# Patient Record
Sex: Female | Born: 1995 | Race: Black or African American | Hispanic: No | Marital: Single
Health system: Southern US, Community
[De-identification: ages and names within clinical notes are randomized; demographics above are authoritative.]

---

## 2021-06-10 ENCOUNTER — Emergency Department
Admission: EM | Admit: 2021-06-10 | Discharge: 2021-06-10 | Disposition: A | Discharge status: Home / Self Care | Payer: Medicaid Other | Attending: Emergency Medicine | Admitting: Emergency Medicine

## 2021-06-10 ENCOUNTER — Emergency Department: Payer: Medicaid Other

## 2021-06-10 ENCOUNTER — Other Ambulatory Visit: Payer: Self-pay

## 2021-06-10 DIAGNOSIS — W19XXXA Unspecified fall, initial encounter: Secondary | ICD-10-CM

## 2021-06-10 DIAGNOSIS — S0990XA Unspecified injury of head, initial encounter: Secondary | ICD-10-CM | POA: Insufficient documentation

## 2021-06-10 DIAGNOSIS — S199XXA Unspecified injury of neck, initial encounter: Secondary | ICD-10-CM | POA: Insufficient documentation

## 2021-06-10 LAB — POC URINE PREG, ED: Preg Test, Ur: NEGATIVE

## 2021-06-10 MED ORDER — METHOCARBAMOL 500 MG PO TABS: 500.0000 mg | ORAL_TABLET | Freq: Three times a day (TID) | ORAL | 0 refills | Status: AC | PRN

## 2021-06-10 MED ORDER — MELOXICAM 15 MG PO TABS: 15.0000 mg | ORAL_TABLET | Freq: Every day | ORAL | 1 refills | Status: AC

## 2021-06-10 NOTE — ED Provider Notes (Signed)
Emergency Medicine Provider Triage Evaluation Note  Leslie Howell , a 25 y.o. female  was evaluated in triage.  Pt complains of headache, decreased hearing in both ears, and upper back pain after flipping backward out of a chair on Saturday. Pain has gradually worsened. She had 2-3 episodes of vomiting this morning.  Review of Systems  Positive: Headache, hearing change, back pain Negative: Blurred vision  Physical Exam  There were no vitals taken for this visit. Gen:   Awake, no distress   Resp:  Normal effort  MSK:   Moves extremities without difficulty. Upper back pain Other:  Tearful  Medical Decision Making    Medically screening exam initiated at 2:50 PM.  Appropriate orders placed.  Leslie Howell was informed that the remainder of the evaluation will be completed by another provider, this initial triage assessment does not replace that evaluation, and the importance of remaining in the ED until their evaluation is complete.    Chinita Pester, FNP 06/10/21 1454    Dionne Bucy, MD 06/10/21 [phone]

## 2021-06-10 NOTE — Discharge Instructions (Addendum)
Take Meloxicam and Robaxin as directed.  

## 2021-06-10 NOTE — ED Provider Notes (Signed)
[facility]-EMERGENCY DEPARTMENT  ____________________________________________  Time seen: Approximately 6:20 PM  I have reviewed the triage vital signs and the nursing notes.   HISTORY  Chief Complaint Fall   Historian Patient     HPI Leslie Howell is a 25 y.o. female presents to the emergency department with neck pain and posterior headache after patient fell backwards from a rolling chair.  Patient states that this injury occurred 5 days ago.  She had no loss of consciousness and states that she did not really have bad pain initially.  She states that her body aches have progressively worsened and she became concerned.  She states that she has had some irregular periods the past couple months and did take a pregnancy test earlier today which was negative.  She denies nausea, vomiting, blurry vision and dizziness.   History reviewed. No pertinent past medical history.   Immunizations up to date:  Yes.     History reviewed. No pertinent past medical history.  There are no problems to display for this patient.   History reviewed. No pertinent surgical history.  Prior to Admission medications   Medication Sig Start Date End Date Taking? Authorizing Provider  meloxicam (MOBIC) 15 MG tablet Take 1 tablet (15 mg total) by mouth daily. 06/10/21 06/10/22 Yes Pia Mau M, PA-C  methocarbamol (ROBAXIN) 500 MG tablet Take 1 tablet (500 mg total) by mouth every 8 (eight) hours as needed for up to 5 days. 06/10/21 06/15/21 Yes Orvil Feil, PA-C    Allergies Patient has no allergy information on record.  No family history on file.  Social History     Review of Systems  Constitutional: No fever/chills Eyes:  No discharge ENT: No upper respiratory complaints. Respiratory: no cough. No SOB/ use of accessory muscles to breath Gastrointestinal:   No nausea, no vomiting.  No diarrhea.  No constipation. Musculoskeletal: Patient has neck pain.  Skin: Negative for rash,  abrasions, lacerations, ecchymosis.   ____________________________________________   PHYSICAL EXAM:  VITAL SIGNS: ED Triage Vitals  Enc Vitals Group     BP 06/10/21 1452 (!) 158/106     Pulse Rate 06/10/21 1452 (!) 115     Resp 06/10/21 1452 19     Temp 06/10/21 1452 98 F (36.7 C)     Temp src --      SpO2 06/10/21 1452 96 %     Weight --      Height --      Head Circumference --      Peak Flow --      Pain Score 06/10/21 1451 9     Pain Loc --      Pain Edu? --      Excl. in GC? --      Constitutional: Alert and oriented. Well appearing and in no acute distress. Eyes: Conjunctivae are normal. PERRL. EOMI. Head: Atraumatic. ENT:      Nose: No congestion/rhinnorhea.      Mouth/Throat: Mucous membranes are moist.  Neck: No stridor.  Full range of motion.  No midline C-spine tenderness to palpation. Cardiovascular: Normal rate, regular rhythm. Normal S1 and S2.  Good peripheral circulation. Respiratory: Normal respiratory effort without tachypnea or retractions. Lungs CTAB. Good air entry to the bases with no decreased or absent breath sounds Gastrointestinal: Bowel sounds x 4 quadrants. Soft and nontender to palpation. No guarding or rigidity. No distention. Musculoskeletal: Full range of motion to all extremities. No obvious deformities noted Neurologic:  Normal for age. No gross  focal neurologic deficits are appreciated.  Skin:  Skin is warm, dry and intact. No rash noted. Psychiatric: Mood and affect are normal for age. Speech and behavior are normal.   ____________________________________________   LABS (all labs ordered are listed, but only abnormal results are displayed)  Labs Reviewed  POC URINE PREG, ED   ____________________________________________  EKG   ____________________________________________  RADIOLOGY Geraldo Pitter, personally viewed and evaluated these images (plain radiographs) as part of my medical decision making, as well as  reviewing the written report by the radiologist.  CT Head Wo Contrast  Result Date: 06/10/2021 CLINICAL DATA:  Larey Seat a few days ago. Now with headache and ringing in the ears. EXAM: CT HEAD WITHOUT CONTRAST TECHNIQUE: Contiguous axial images were obtained from the base of the skull through the vertex without intravenous contrast. COMPARISON:  None. FINDINGS: Brain: The ventricles are normal in size and configuration. No extra-axial fluid collections are identified. The gray-white differentiation is maintained. No CT findings for acute hemispheric infarction or intracranial hemorrhage. No mass lesions. The brainstem and cerebellum are normal. Vascular: No hyperdense vessels or obvious aneurysm. Skull: No acute skull fracture.  No bone lesion. Sinuses/Orbits: The paranasal sinuses and mastoid air cells are clear. The globes are intact. Other: No scalp lesions, laceration or hematoma. IMPRESSION: Normal head CT. Electronically Signed   By: Rudie Meyer M.D.   On: 06/10/2021 15:48   CT Cervical Spine Wo Contrast  Result Date: 06/10/2021 CLINICAL DATA:  Neck pain.  Larey Seat a few days ago. EXAM: CT CERVICAL SPINE WITHOUT CONTRAST TECHNIQUE: Multidetector CT imaging of the cervical spine was performed without intravenous contrast. Multiplanar CT image reconstructions were also generated. COMPARISON:  None. FINDINGS: Alignment: Normal Skull base and vertebrae: No acute fracture. No primary bone lesion or focal pathologic process. Soft tissues and spinal canal: No prevertebral fluid or swelling. No visible canal hematoma. Disc levels: The spinal canal is generous. No spinal or foraminal stenosis. Upper chest: The lung apices are clear. Other: No neck mass or adenopathy. The thyroid gland is unremarkable. IMPRESSION: 1. Normal alignment and no acute cervical spine fracture. 2. The spinal canal is generous. No spinal or foraminal stenosis. Electronically Signed   By: Rudie Meyer M.D.   On: 06/10/2021 15:51     ____________________________________________    PROCEDURES  Procedure(s) performed:     Procedures     Medications - No data to display   ____________________________________________   INITIAL IMPRESSION / ASSESSMENT AND PLAN / ED COURSE  Pertinent labs & imaging results that were available during my care of the patient were reviewed by me and considered in my medical decision making (see chart for details).      Assessment and plan:  Fall:  25 year old female presents to the emergency department after a mechanical fall 5 days ago.  CTs of the head and cervical spine were reassuring.  Urine pregnancy test was negative.  She was discharged with meloxicam and Robaxin.     ____________________________________________  FINAL CLINICAL IMPRESSION(S) / ED DIAGNOSES  Final diagnoses:  Fall, initial encounter      NEW MEDICATIONS STARTED DURING THIS VISIT:  ED Discharge Orders          Ordered    meloxicam (MOBIC) 15 MG tablet  Daily        06/10/21 1855    methocarbamol (ROBAXIN) 500 MG tablet  Every 8 hours PRN        06/10/21 1855  This chart was dictated using voice recognition software/Dragon. Despite best efforts to proofread, errors can occur which can change the meaning. Any change was purely unintentional.     Orvil Feil, PA-C 06/10/21 1858    Gilles Chiquito, MD 06/10/21 Norberta Keens

## 2021-06-10 NOTE — ED Triage Notes (Signed)
Pt comes with c/o fall on Saturday. Pt states she had a mechanical fall. Pt states pain to lower back, side and ringing in ears. Pt denies any LOC. Pt states she did hit her head.  Pt states pain is now worse.

## 2022-12-20 IMAGING — CT CT CERVICAL SPINE W/O CM
3 of 4 series · 9 of 33 positions shown, 11 images · non-contrast
Comparison: None.

CLINICAL DATA: Neck pain.  Fell a few days ago.

EXAM:
CT CERVICAL SPINE WITHOUT CONTRAST
TECHNIQUE: Multidetector CT imaging of the cervical spine was performed without
intravenous contrast. Multiplanar CT image reconstructions were also
generated.

[Series 6: sagittal bone · sagittal · 0.18mm/px · 5 of 72 slices shown, 6 images]
[im 24/72  bone]
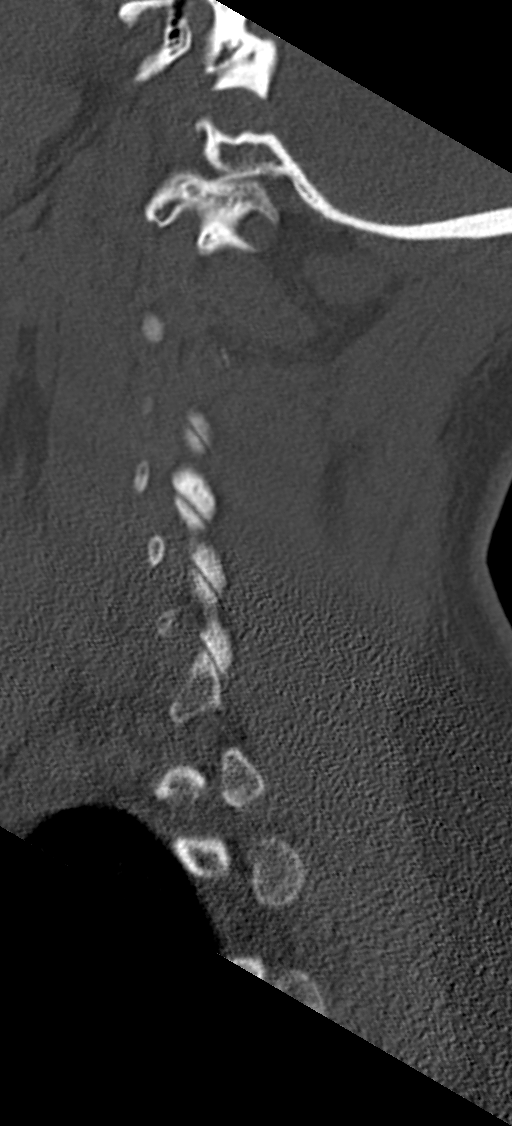
[im 30/72  bone]
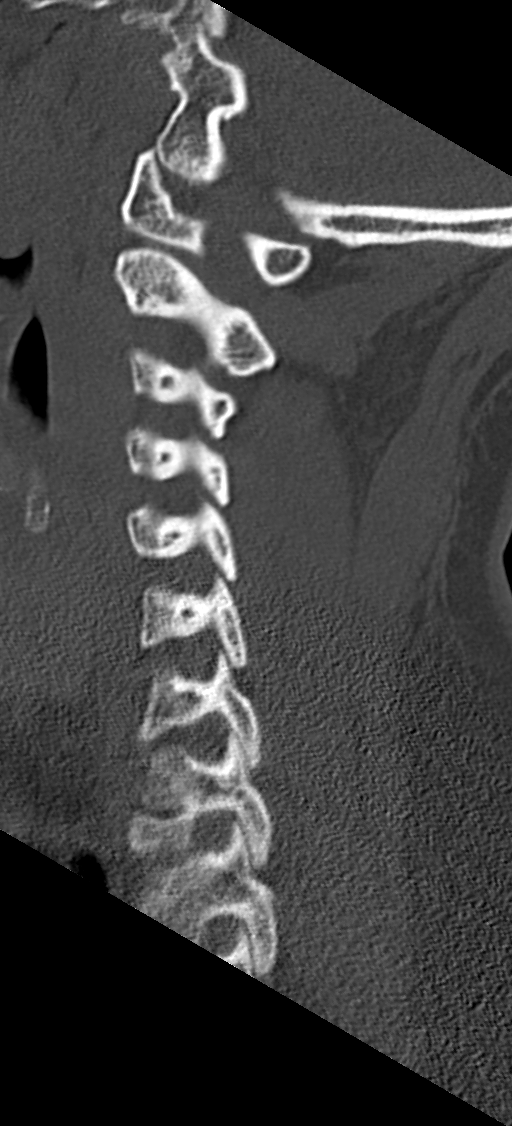
[im 36/72  soft-tissue]
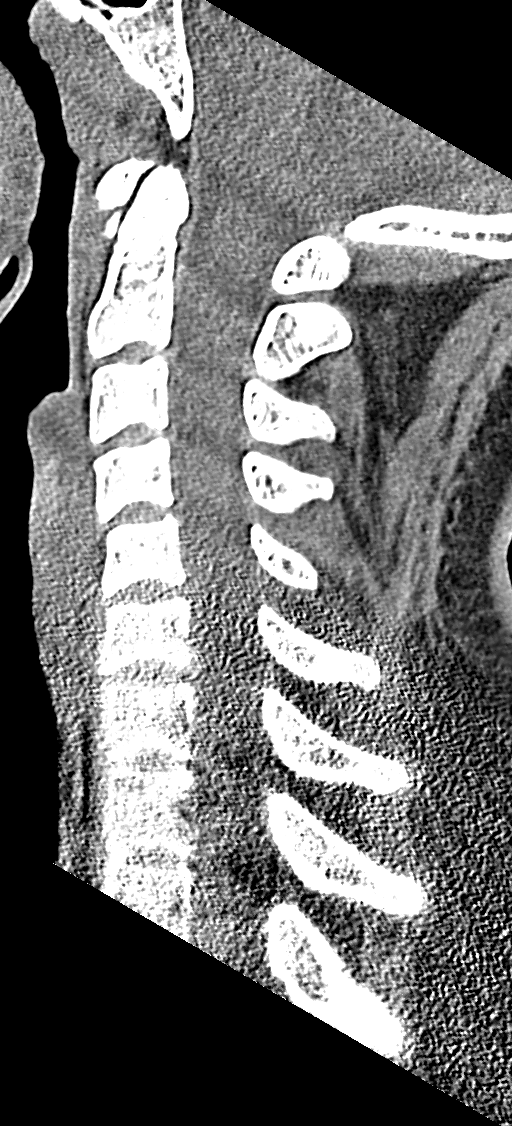
[im 36/72  bone]
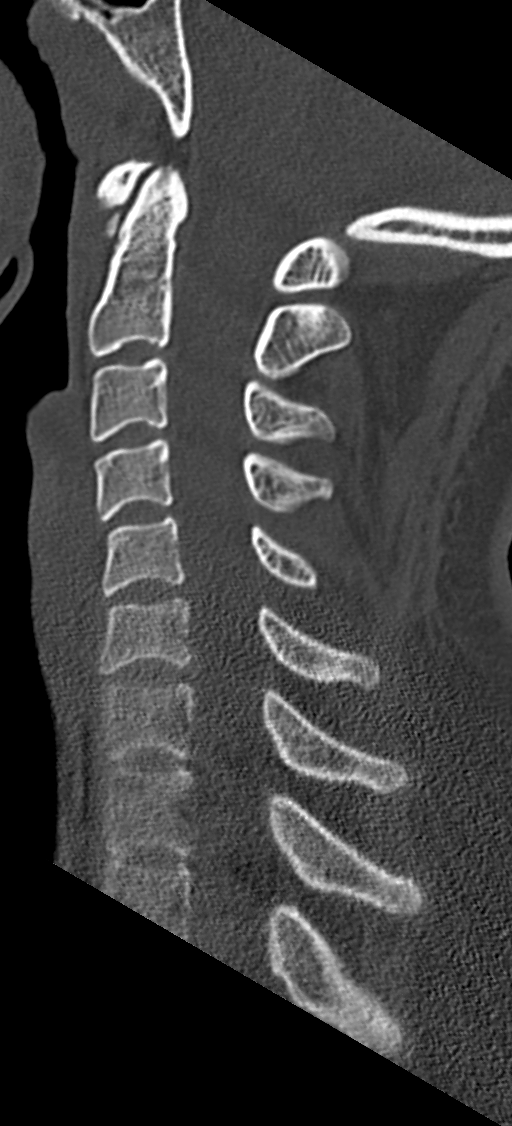
[im 42/72  bone]
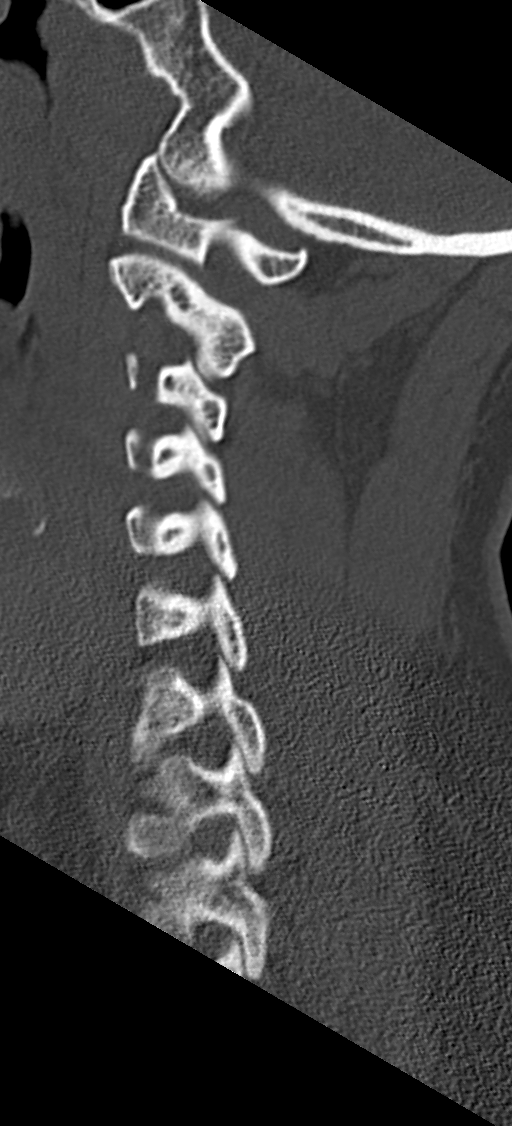
[im 48/72  bone]
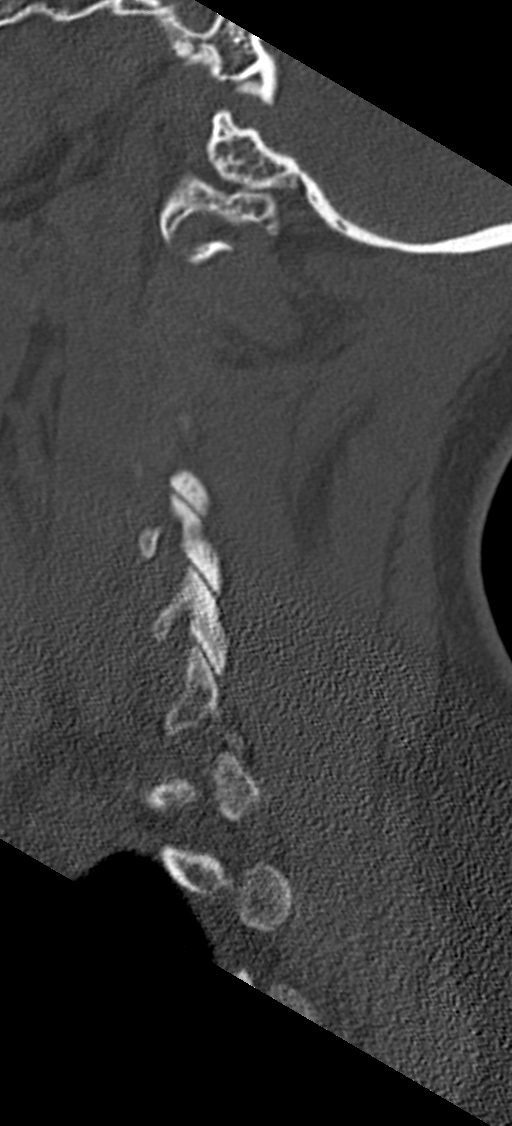

[Series 7: coronal bone · coronal · 0.28mm/px · 3 of 46 slices shown]
[im 10/46  bone]
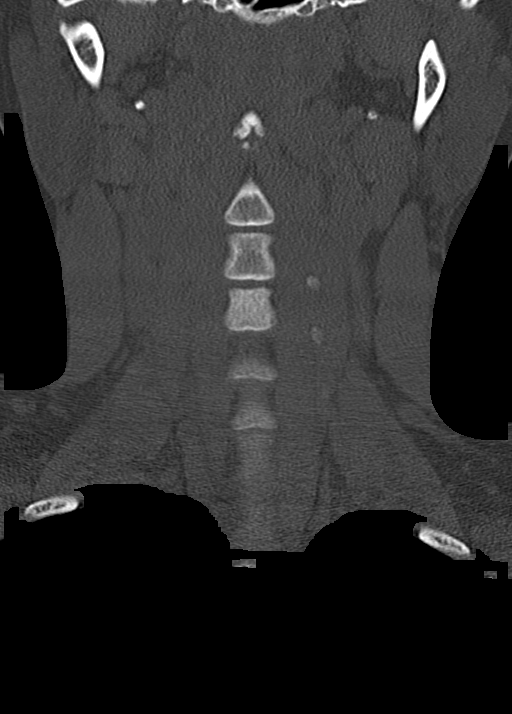
[im 19/46  bone]
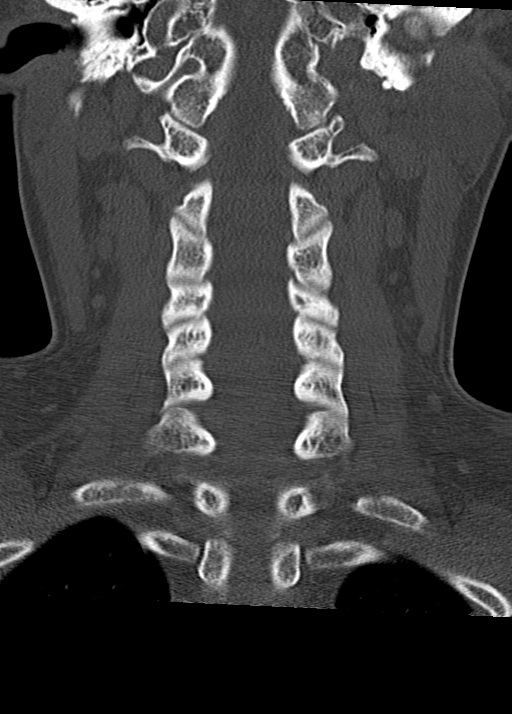
[im 28/46  bone]
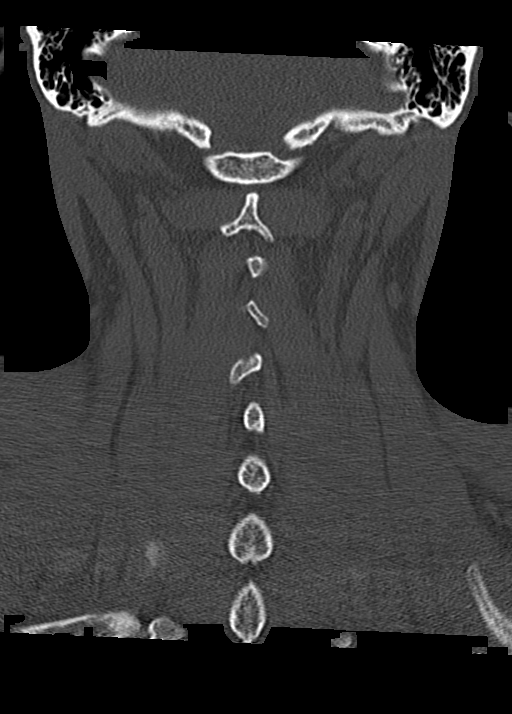

[Series 8: orthogonal bone · axial · 0.18mm/px · z∈[-292,-292]mm · 1 of 100 slices shown, 2 images]
[im 57/100  soft-tissue]
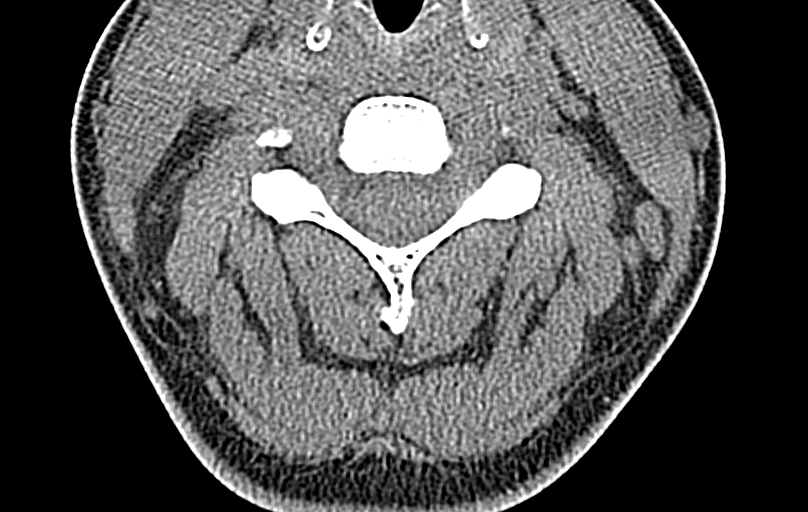
[im 57/100  bone]
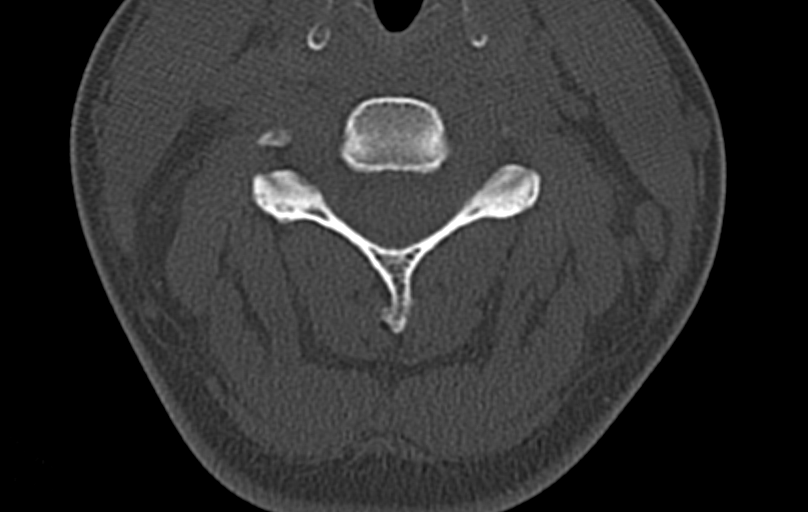

[9 of 33 positions shown; findings below may reference images not displayed]

FINDINGS: Alignment: Normal

Skull base and vertebrae: No acute fracture. No primary bone lesion
or focal pathologic process.

Soft tissues and spinal canal: No prevertebral fluid or swelling. No
visible canal hematoma.

Disc levels: The spinal canal is generous. No spinal or foraminal
stenosis.

Upper chest: The lung apices are clear.

Other: No neck mass or adenopathy. The thyroid gland is
unremarkable.
IMPRESSION: 1. Normal alignment and no acute cervical spine fracture.
2. The spinal canal is generous. No spinal or foraminal stenosis.
